# Patient Record
Sex: Male | Born: 1955 | Race: White | Hispanic: No | Marital: Single | Smoking: Former smoker
Health system: Southern US, Community
[De-identification: ages and names within clinical notes are randomized; demographics above are authoritative.]

## PROBLEM LIST (undated history)

## (undated) DIAGNOSIS — E119 Type 2 diabetes mellitus without complications: Secondary | ICD-10-CM

## (undated) DIAGNOSIS — E785 Hyperlipidemia, unspecified: Secondary | ICD-10-CM

## (undated) DIAGNOSIS — I2510 Atherosclerotic heart disease of native coronary artery without angina pectoris: Secondary | ICD-10-CM

## (undated) DIAGNOSIS — I10 Essential (primary) hypertension: Secondary | ICD-10-CM

## (undated) HISTORY — PX: NECK SURGERY: SHX720

## (undated) HISTORY — PX: BACK SURGERY: SHX140

---

## 2015-03-07 ENCOUNTER — Observation Stay (HOSPITAL_COMMUNITY)
Admission: EM | Admit: 2015-03-07 | Discharge: 2015-03-08 | Disposition: A | Discharge status: Home / Self Care | Payer: PRIVATE HEALTH INSURANCE | Attending: Family Medicine | Admitting: Family Medicine

## 2015-03-07 ENCOUNTER — Encounter (HOSPITAL_COMMUNITY): Payer: Self-pay

## 2015-03-07 ENCOUNTER — Emergency Department (HOSPITAL_COMMUNITY): Payer: PRIVATE HEALTH INSURANCE

## 2015-03-07 DIAGNOSIS — I25119 Atherosclerotic heart disease of native coronary artery with unspecified angina pectoris: Secondary | ICD-10-CM

## 2015-03-07 DIAGNOSIS — R9431 Abnormal electrocardiogram [ECG] [EKG]: Secondary | ICD-10-CM | POA: Insufficient documentation

## 2015-03-07 DIAGNOSIS — I10 Essential (primary) hypertension: Secondary | ICD-10-CM

## 2015-03-07 DIAGNOSIS — E119 Type 2 diabetes mellitus without complications: Secondary | ICD-10-CM | POA: Diagnosis not present

## 2015-03-07 DIAGNOSIS — E876 Hypokalemia: Secondary | ICD-10-CM | POA: Diagnosis present

## 2015-03-07 DIAGNOSIS — M549 Dorsalgia, unspecified: Secondary | ICD-10-CM | POA: Diagnosis present

## 2015-03-07 DIAGNOSIS — R079 Chest pain, unspecified: Secondary | ICD-10-CM | POA: Diagnosis not present

## 2015-03-07 DIAGNOSIS — R0600 Dyspnea, unspecified: Secondary | ICD-10-CM

## 2015-03-07 DIAGNOSIS — R55 Syncope and collapse: Secondary | ICD-10-CM | POA: Diagnosis present

## 2015-03-07 DIAGNOSIS — M546 Pain in thoracic spine: Secondary | ICD-10-CM

## 2015-03-07 HISTORY — DX: Type 2 diabetes mellitus without complications: E11.9

## 2015-03-07 HISTORY — DX: Atherosclerotic heart disease of native coronary artery without angina pectoris: I25.10

## 2015-03-07 HISTORY — DX: Hyperlipidemia, unspecified: E78.5

## 2015-03-07 HISTORY — DX: Essential (primary) hypertension: I10

## 2015-03-07 LAB — COMPREHENSIVE METABOLIC PANEL
ALK PHOS: 81 U/L (ref 38–126)
ALT: 15 U/L — ABNORMAL LOW (ref 17–63)
ANION GAP: 10 (ref 5–15)
AST: 23 U/L (ref 15–41)
Albumin: 3.6 g/dL (ref 3.5–5.0)
BUN: 14 mg/dL (ref 6–20)
CALCIUM: 8.7 mg/dL — AB (ref 8.9–10.3)
CO2: 26 mmol/L (ref 22–32)
Chloride: 103 mmol/L (ref 101–111)
Creatinine, Ser: 1.07 mg/dL (ref 0.61–1.24)
GFR calc Af Amer: >60 mL/min (ref >60)
GFR calc non Af Amer: >60 mL/min (ref >60)
GLUCOSE: 123 mg/dL — AB (ref 65–99)
Potassium: 2.9 mmol/L — ABNORMAL LOW (ref 3.5–5.1)
Sodium: 139 mmol/L (ref 135–145)
Total Bilirubin: 0.6 mg/dL (ref 0.3–1.2)
Total Protein: 6.3 g/dL — ABNORMAL LOW (ref 6.5–8.1)

## 2015-03-07 LAB — GLUCOSE, CAPILLARY
Glucose-Capillary: 73 mg/dL (ref 65–99)
Glucose-Capillary: 86 mg/dL (ref 65–99)

## 2015-03-07 LAB — CBC WITH DIFFERENTIAL/PLATELET
BASOS ABS: 0 10*3/uL (ref 0.0–0.1)
BASOS PCT: 0 %
EOS ABS: 0.2 10*3/uL (ref 0.0–0.7)
Eosinophils Relative: 2 %
HCT: 42.2 % (ref 39.0–52.0)
HEMOGLOBIN: 14.5 g/dL (ref 13.0–17.0)
Lymphocytes Relative: 11 %
Lymphs Abs: 0.7 10*3/uL (ref 0.7–4.0)
MCH: 28.8 pg (ref 26.0–34.0)
MCHC: 34.4 g/dL (ref 30.0–36.0)
MCV: 83.7 fL (ref 78.0–100.0)
MONOS PCT: 10 %
Monocytes Absolute: 0.7 10*3/uL (ref 0.1–1.0)
NEUTROS PCT: 77 %
Neutro Abs: 5.1 10*3/uL (ref 1.7–7.7)
Platelets: 186 10*3/uL (ref 150–400)
RBC: 5.04 MIL/uL (ref 4.22–5.81)
RDW: 13.1 % (ref 11.5–15.5)
WBC: 6.7 10*3/uL (ref 4.0–10.5)

## 2015-03-07 LAB — CBC
HCT: 42.3 % (ref 39.0–52.0)
Hemoglobin: 14.4 g/dL (ref 13.0–17.0)
MCH: 28.9 pg (ref 26.0–34.0)
MCHC: 34 g/dL (ref 30.0–36.0)
MCV: 84.8 fL (ref 78.0–100.0)
PLATELETS: 188 10*3/uL (ref 150–400)
RBC: 4.99 MIL/uL (ref 4.22–5.81)
RDW: 13.1 % (ref 11.5–15.5)
WBC: 7.7 10*3/uL (ref 4.0–10.5)

## 2015-03-07 LAB — CREATININE, SERUM
CREATININE: 0.92 mg/dL (ref 0.61–1.24)
GFR calc Af Amer: >60 mL/min (ref >60)
GFR calc non Af Amer: >60 mL/min (ref >60)

## 2015-03-07 LAB — TROPONIN I
Troponin I: <0.03 ng/mL (ref <0.031)
Troponin I: <0.03 ng/mL (ref <0.031)

## 2015-03-07 LAB — MAGNESIUM: MAGNESIUM: 1.8 mg/dL (ref 1.7–2.4)

## 2015-03-07 LAB — CBG MONITORING, ED: Glucose-Capillary: 106 mg/dL — ABNORMAL HIGH (ref 65–99)

## 2015-03-07 LAB — BRAIN NATRIURETIC PEPTIDE: B NATRIURETIC PEPTIDE 5: 83.8 pg/mL (ref 0.0–100.0)

## 2015-03-07 MED ORDER — INSULIN ASPART 100 UNIT/ML ~~LOC~~ SOLN: 0.0000 [IU] | Freq: Three times a day (TID) | SUBCUTANEOUS | Status: DC

## 2015-03-07 MED ORDER — POTASSIUM CHLORIDE CRYS ER 20 MEQ PO TBCR
40.0000 meq | EXTENDED_RELEASE_TABLET | Freq: Once | ORAL | Status: AC
  Administered 2015-03-07: 40 meq via ORAL
  Filled 2015-03-07: qty 2

## 2015-03-07 MED ORDER — SODIUM CHLORIDE 0.9 % IV SOLN: INTRAVENOUS | Status: DC

## 2015-03-07 MED ORDER — INSULIN PUMP
Freq: Three times a day (TID) | SUBCUTANEOUS | Status: DC
  Administered 2015-03-07 – 2015-03-08 (×2): 20 via SUBCUTANEOUS
  Filled 2015-03-07: qty 1

## 2015-03-07 MED ORDER — MAGNESIUM SULFATE 2 GM/50ML IV SOLN
2.0000 g | Freq: Once | INTRAVENOUS | Status: AC
  Administered 2015-03-07: 2 g via INTRAVENOUS
  Filled 2015-03-07: qty 50

## 2015-03-07 MED ORDER — ENOXAPARIN SODIUM 40 MG/0.4ML ~~LOC~~ SOLN
40.0000 mg | SUBCUTANEOUS | Status: DC
  Administered 2015-03-07: 40 mg via SUBCUTANEOUS
  Filled 2015-03-07: qty 0.4

## 2015-03-07 MED ORDER — INSULIN ASPART 100 UNIT/ML ~~LOC~~ SOLN: 0.0000 [IU] | Freq: Every day | SUBCUTANEOUS | Status: DC

## 2015-03-07 MED ORDER — ACETAMINOPHEN 325 MG PO TABS: 650.0000 mg | ORAL_TABLET | ORAL | Status: DC | PRN

## 2015-03-07 MED ORDER — ATORVASTATIN CALCIUM 40 MG PO TABS: 40.0000 mg | ORAL_TABLET | Freq: Every day | ORAL | Status: DC

## 2015-03-07 MED ORDER — MORPHINE SULFATE (PF) 2 MG/ML IV SOLN: 2.0000 mg | INTRAVENOUS | Status: DC | PRN

## 2015-03-07 MED ORDER — ASPIRIN 325 MG PO TABS
325.0000 mg | ORAL_TABLET | Freq: Every day | ORAL | Status: DC
  Administered 2015-03-08: 325 mg via ORAL
  Filled 2015-03-07: qty 1

## 2015-03-07 NOTE — Discharge Summary (Signed)
Family Medicine Teaching [facility] Discharge Summary  Patient name: Richard Robbins Medical record number: [id number] Date of birth: 1956/05/19 Age: 59 y.o. Gender: male Date of Admission: 03/07/2015  Date of Discharge: 03/08/2015 Admitting Physician: Leighton Roach McDiarmid, MD  Primary Care Kalle Bernath: No primary care Nissim Fleischer on file. Consultants: Cardiology  Indication for Hospitalization: ACS rule out  Discharge Diagnoses/Problem List:  Back pain Hypertension Hypokalemia  Disposition: Discharge home  Discharge Condition: Stable  Discharge Exam:  General: male lying in bed in NAD. Wife in room  ENTM: Oropharynx clear. Neck: Full ROM. Non-tender to palpation.  Cardiovascular: Regular rate. Regular rhythm, no murmurs. Respiratory: CTAB. Normal WOB.  Abdomen: obese, soft, NT +BS MSK: Trace LE edema to mid shins. Moves all extremities.  Neuro: Alert and oriented. No gross deficits.  Psych: Appropriate mood and affect.   Brief Hospital Course:   HPI: Richard Robbins is a 59 y.o. male presenting with chest pain located between his shoulder blades. Earlier today, he was outside at the [employer] when he started feeling "funny" and tried to sit down. At that time he started devloping back pain pain with no radiation. This pain is similar to pain he felt when he last had an MI. He reports some associated nausea, lightheadedness and diaphoresis at the time. He reports no fevers, chills. Cardiology and PCP in [place], [place]. He has a history of stent placement two years ago.  Brought in by EMS and was given nitroglycerin. Was hypotensive in route after nitro but stable and conscious. States that Nitro did not relieve his pain. But he is currently pain free. BP stable throughout time in ED. Took all of his medications this morning.   Course: patient observed with ACS rule out protocol. Repeat EKG showed some t-wave inversion in anterior leads. Troponin negative x3. Cardiology  recommended outpatient stress test. Patient's chest pain did not recur and he remained stable throughout admission. CTA obtained to confirm no dissection or PE and was negative.  Issues for Follow Up:  1. Outpatient stress test  Significant Procedures: None  Significant Labs and Imaging:   Recent Labs Lab 03/07/15 1220 03/07/15 1814  WBC 6.7 7.7  HGB 14.5 14.4  HCT 42.2 42.3  PLT 186 188    Recent Labs Lab 03/07/15 1220 03/07/15 1814 03/08/15 0418  NA 139  --  139  K 2.9*  --  3.4*  CL 103  --  104  CO2 26  --  28  GLUCOSE 123*  --  75  BUN 14  --  11  CREATININE 1.07 0.92 0.78  CALCIUM 8.7*  --  8.8*  MG  --  1.8 2.1  ALKPHOS 81  --   --   AST 23  --   --   ALT 15*  --   --   ALBUMIN 3.6  --   --     Cardiac Panel (last 3 results)  Recent Labs  03/07/15 1814 03/07/15 2233 03/08/15 0418  TROPONINI <0.03 <0.03 <0.03   Risk Stratification Labs  TSH    Component Value Date/Time   TSH 1.286 03/08/2015 0418   Hemoglobin A1C No results found for: HGBA1C Lipid Panel     Component Value Date/Time   CHOL 156 03/08/2015 0418   TRIG 137 03/08/2015 0418   HDL 20* 03/08/2015 0418   CHOLHDL 7.8 03/08/2015 0418   VLDL 27 03/08/2015 0418   LDLCALC 109* 03/08/2015 0418    Results/Tests Pending at Time of Discharge: Hemoglobin A1C  Discharge Medications:    Medication List    TAKE these medications        aspirin 325 MG tablet  Take 325 mg by mouth daily.     atorvastatin 10 MG tablet  Commonly known as:  LIPITOR  Take 10 mg by mouth daily.     doxazosin 8 MG tablet  Commonly known as:  CARDURA  Take 8 mg by mouth daily.     insulin pump Soln  Inject into the skin 3 (three) times daily.     losartan-hydrochlorothiazide 100-25 MG per tablet  Commonly known as:  HYZAAR  Take 1 tablet by mouth daily.     metFORMIN 1000 MG tablet  Commonly known as:  GLUCOPHAGE  Take 1,000 mg by mouth 2 (two) times daily with a meal.     nitroGLYCERIN 0.4 MG  SL tablet  Commonly known as:  NITROSTAT  Place 0.4 mg under the tongue every 5 (five) minutes as needed for chest pain.        Discharge Instructions: Please refer to Patient Instructions section of EMR for full details.  Patient was counseled important signs and symptoms that should prompt return to medical care, changes in medications, dietary instructions, activity restrictions, and follow up appointments.   Follow-Up Appointments: Follow-up Information    Follow up with Primary Care Physician. Schedule an appointment as soon as possible for a visit in 1 week.   Why:  For hospital follow-up      Narda Bonds, MD 03/09/2015, 9:03 AM PGY-3, [facility]

## 2015-03-07 NOTE — Progress Notes (Signed)
   03/07/15 1226  Clinical Encounter Type  Visited With Patient and family together;Health care provider  Visit Type Initial  Referral From Care management   Chaplain responded to page from ED, and offered transport and support to the patient's wife. Chaplain support available as needed.   Alda Ponder, Chaplain 03/07/2015 12:27 PM

## 2015-03-07 NOTE — ED Provider Notes (Signed)
CSN: [id number]     Arrival date & time 03/07/15  1211 History   First MD Initiated Contact with Patient 03/07/15 1213     No chief complaint on file.    (Consider location/radiation/quality/duration/timing/severity/associated sxs/prior Treatment) HPI Comments: Patient here complaining of sudden onset of pain between her shoulder blades similar to his anginal equivalent associated with near syncope. Does have a history of coronary artery disease and this is his similar to his prior MI symptoms. Had associated diaphoresis and slight dyspnea. EMS called and they gave patient nitroglycerin and dropped his blood pressure to 98 systolic. Pain did improve. His pain did not radiate to his arms. No recent fever chills or cough symptoms. Denies any leg pain or swelling.  The history is provided by the patient.    No past medical history on file. No past surgical history on file. No family history on file. Social History  Substance Use Topics  . Smoking status: Not on file  . Smokeless tobacco: Not on file  . Alcohol Use: Not on file    Review of Systems  All other systems reviewed and are negative.     Allergies  Review of patient's allergies indicates not on file.  Home Medications   Prior to Admission medications   Not on File   There were no vitals taken for this visit. Physical Exam  Constitutional: He is oriented to person, place, and time. He appears well-developed and well-nourished.  Non-toxic appearance. No distress.  HENT:  Head: Normocephalic and atraumatic.  Eyes: Conjunctivae, EOM and lids are normal. Pupils are equal, round, and reactive to light.  Neck: Normal range of motion. Neck supple. No tracheal deviation present. No thyroid mass present.  Cardiovascular: Normal rate, regular rhythm and normal heart sounds.  Exam reveals no gallop.   No murmur heard. Pulmonary/Chest: Effort normal and breath sounds normal. No stridor. No respiratory distress. He has no  decreased breath sounds. He has no wheezes. He has no rhonchi. He has no rales.  Abdominal: Soft. Normal appearance and bowel sounds are normal. He exhibits no distension. There is no tenderness. There is no rebound and no CVA tenderness.  Musculoskeletal: Normal range of motion. He exhibits no edema or tenderness.  Neurological: He is alert and oriented to person, place, and time. He has normal strength. No cranial nerve deficit or sensory deficit. GCS eye subscore is 4. GCS verbal subscore is 5. GCS motor subscore is 6.  Skin: Skin is warm and dry. No abrasion and no rash noted.  Psychiatric: He has a normal mood and affect. His speech is normal and behavior is normal.  Nursing note and vitals reviewed.   ED Course  Procedures (including critical care time) Labs Review Labs Reviewed  CBC WITH DIFFERENTIAL/PLATELET  COMPREHENSIVE METABOLIC PANEL  TROPONIN I    Imaging Review No results found. I have personally reviewed and evaluated these images and lab results as part of my medical decision-making.   EKG Interpretation   Date/Time:  Saturday March 07 2015 12:11:06 EDT Ventricular Rate:  87 PR Interval:  162 QRS Duration: 101 QT Interval:  481 QTC Calculation: 579 R Axis:   62 Text Interpretation:  Sinus rhythm Probable left atrial enlargement  Borderline T wave abnormalities Prolonged QT interval Confirmed by Freida Busman   MD, ANTHONY ([id number]) on 03/07/2015 12:17:35 PM      MDM   Final diagnoses:  Chest pain   patient to be admitted for evaluation of chest pain   Ethelene Browns  Freida Busman, MD 03/07/15 1429

## 2015-03-07 NOTE — ED Notes (Signed)
Pt remains monitored by blood pressure, pulse ox, and 12 lead. Pts family remains at bedside.  

## 2015-03-07 NOTE — ED Notes (Signed)
Pt placed on monitor upon arrival to room. Pt monitored by blood pressure, pulse ox, and 12 lead. pts EKG given to and signed by Dr. Freida Busman.

## 2015-03-07 NOTE — H&P (Signed)
Family Medicine Teaching [facility] Admission History and Physical Service Pager: [phone]  Patient name: Richard Robbins Medical record number: [id number] Date of birth: [birth year] Age: 59 y.o. Gender: male  Primary Care Provider: No primary care provider on file. Consultants: Cardiology Code Status: Full per discussion on admission   Chief Complaint: Back pain  Assessment and Plan: Darious Rehman is a 59 y.o. male presenting with back pain concerning for ACS . PMH is significant for CAD with h/o of stent placement x2 years ago, HTN, BPH, and T2DM on insulin pump.   Posterior Chest Pain: Patient reports pain between his shoulder blades similar to pain he felt when had MI 2 years ago. Pain has since resolved. VSS with 96% O2 sat on RA. iStat Troponin < 0.03. EKG normal sinus rhythm with diffuse flattening of T waves and prolonged QTc of 579. CXR demonstrated mild cardiomegaly. HEART score calculated to be 5. Given patient's cardiac history, presenting symptoms, and risk factors will proceed with ACS r/o.  -admit to inpatient and place in observation with telemetry  -consult cardiology  -trend troponin x3 q6h -repeat EKG in AM  -supplemental O2 for sats <92% prn  -continue ASA and Lipitor  -morphine  q2h for severe chest pain -did not order zofran 2/2 to prolonged QTc - risk start (A1C, TSH, Lipid panel)  Hypokalemia: K of 2.9 in the ED. Given of Kdur in ED.  -ordered of Kdur  -Mag ordered and will replete prn   Magnesium: 1.8 on admission. Would like to keep above 2 - mag citrate 2g x1 - recheck mag in AM  DM Type II: Patient managed with insulin pump at home.  - continue insulin pump via order set -hold metformin  Hypertension: Stable at admission -hold anti-hypertensive medications for now; resume as needed -continue to monitor   BPH  -hold Doxazosin   FEN/GI: Heart healthy diet, SLIV Prophylaxis: Lovenox 40 mg q24 hours   Disposition: Place in  observation with telemetry; FPTS Attending McDiarmid   History of Present Illness:  Richard Robbins is a 59 y.o. male presenting with chest pain located between his shoulder blades. Earlier today, he was outside at the [employer] when he started feeling "funny" and tried to sit down. At that time he started devloping back pain pain with no radiation. This pain is similar to pain he felt when he last had an MI. He reports some associated nausea, lightheadedness and diaphoresis at the time. He reports no fevers, chills. Cardiology and PCP in [place], [place]. He has a history of stent placement two years ago.  Brought in by EMS and was given nitroglycerin. Was hypotensive in route after nitro but stable and conscious. States that Nitro did not relieve his pain. But he is currently pain free. BP stable throughout time in ED. Took all of his medications this morning.   Pharmacy to perform med rec for patient. He remembers that he takes the following:  Lipitor, dose unknown Aspirin  Metformin  BID Doxazosin, dose unknown Insulin pump Unknown antihypertensive  Review Of Systems: Per HPI with the following additions: None  Otherwise 12 point review of systems was performed and was unremarkable.  Patient Active Problem List   Diagnosis Date Noted  . Chest pain 03/07/2015   Past Medical History: Past Medical History  Diagnosis Date  . Diabetes mellitus without complication   . Hypertension   . Hyperlipemia    Past Surgical History: Past Surgical History  Procedure Laterality Date  .  Back surgery    Neck surgery  Social History: Social History  Substance Use Topics  . Smoking status: Former Smoker    Types: Cigarettes  . Smokeless tobacco: None  . Alcohol Use: No   Additional social history: Denies drug use.   Please also refer to relevant sections of EMR.  Family History: History reviewed. No pertinent family history. Allergies and Medications: Allergies  Allergen  Reactions  . Protamine Swelling   No current facility-administered medications on file prior to encounter.   No current outpatient prescriptions on file prior to encounter.    Objective: BP 140/73 mmHg  Pulse 80  Temp(Src) 99.2 F (37.3 C) (Oral)  Resp 24  Ht  (1.88 m)  Wt 338 lb (153.316 kg)  BMI 43.38 kg/m2  SpO2 96% Exam: General: male lying in bed in NAD, wife and son at bedside  Eyes: EOMI. Pupils equal and round.  ENTM: Oropharynx clear. Neck: Full ROM. Non-tender to palpation.  Cardiovascular: Regular rate. Occasionally irregular rhythm. PVCs noted on monitor. No murmurs appreciated. Distal pulses intact. Chest wall and back non-tender to palpation.  Respiratory: CTAB. Normal WOB.  Abdomen: obese, soft, NT +BS MSK: 1+ LE edema to mid shins. Moves all extremities.  Neuro: Alert and oriented. No gross deficits.  Psych: Appropriate mood and affect.   Labs and Imaging: CBC BMET   Recent Labs Lab 03/07/15 1220  WBC 6.7  HGB 14.5  HCT 42.2  PLT 186    Recent Labs Lab 03/07/15 1220  NA 139  K 2.9*  CL 103  CO2 26  BUN 14  CREATININE 1.07  GLUCOSE 123*  CALCIUM 8.7*     Cardiac Panel (last 3 results)  Recent Labs  03/07/15 1220 03/07/15 1814  TROPONINI <0.03 <0.03    Risk Stratification Labs  TSH No results found for: TSH Hemoglobin A1C No results found for: HGBA1C Lipid Panel  No results found for: CHOL, TRIG, HDL, CHOLHDL, VLDL, LDLCALC   EKG (2/95 in ED): Vent. rate 87 BPM PR interval 162 ms QRS duration 101 ms QT/QTc 481/579 ms P-R-T axes 45 62 9 Sinus rhythm Probable left atrial enlargement Borderline T wave abnormalities Prolonged QT interval  Dg Chest 2 View  03/07/2015   CLINICAL DATA:  Posterior chest pain between shoulder blades. Shortness of breath for 2 hours.  EXAM: CHEST  2 VIEW  COMPARISON:  None.  FINDINGS: Mild cardiomegaly. Mediastinal contours are within normal limits. Mild interstitial prominence throughout  the lungs, likely chronic although early interstitial edema cannot be excluded. No confluent opacities or effusions. No acute bony abnormality.  IMPRESSION: Mild cardiomegaly. Interstitial prominence, favor chronic lung disease although interstitial edema cannot be excluded.   Electronically Signed   By: Charlett Nose M.D.   On: 03/07/2015 12:55    Marcy Siren, D.O. 03/07/2015, 3:36 PM PGY-1, [facility] FPTS Intern pager: [phone], text pages welcome  I have seen and examined the patient. I have read and agree with the above note. My changes are noted in blue.  Jacquelin Hawking, MD PGY-3, [facility] 03/07/2015, 10:03 PM

## 2015-03-07 NOTE — ED Notes (Signed)
Attempted report x1. 

## 2015-03-07 NOTE — ED Notes (Signed)
Per EMS patient coming from festival where he was walking around and  became light headed and complaining of pain between shoulders.  EMS reports that gave 1 nitro and 500 mL of NS and 4 mg of zofran in route, EMS CBG 106  Patient reports a pain of 2 on 0-10, takes a daily aspirin of 325 mg (took this morning) Reports that he is diabetic (insulin resistant).  Patient A&O X4, NAD at this time.  Wife at bedside.

## 2015-03-08 ENCOUNTER — Encounter (HOSPITAL_COMMUNITY): Payer: Self-pay | Admitting: Physician Assistant

## 2015-03-08 ENCOUNTER — Observation Stay (HOSPITAL_COMMUNITY): Payer: PRIVATE HEALTH INSURANCE

## 2015-03-08 DIAGNOSIS — E119 Type 2 diabetes mellitus without complications: Secondary | ICD-10-CM

## 2015-03-08 DIAGNOSIS — I10 Essential (primary) hypertension: Secondary | ICD-10-CM | POA: Diagnosis not present

## 2015-03-08 DIAGNOSIS — R079 Chest pain, unspecified: Secondary | ICD-10-CM | POA: Insufficient documentation

## 2015-03-08 DIAGNOSIS — R9431 Abnormal electrocardiogram [ECG] [EKG]: Secondary | ICD-10-CM | POA: Insufficient documentation

## 2015-03-08 DIAGNOSIS — M546 Pain in thoracic spine: Secondary | ICD-10-CM | POA: Diagnosis not present

## 2015-03-08 LAB — BASIC METABOLIC PANEL
ANION GAP: 7 (ref 5–15)
BUN: 11 mg/dL (ref 6–20)
CALCIUM: 8.8 mg/dL — AB (ref 8.9–10.3)
CO2: 28 mmol/L (ref 22–32)
Chloride: 104 mmol/L (ref 101–111)
Creatinine, Ser: 0.78 mg/dL (ref 0.61–1.24)
GFR calc Af Amer: >60 mL/min (ref >60)
Glucose, Bld: 75 mg/dL (ref 65–99)
POTASSIUM: 3.4 mmol/L — AB (ref 3.5–5.1)
SODIUM: 139 mmol/L (ref 135–145)

## 2015-03-08 LAB — LIPID PANEL
CHOL/HDL RATIO: 7.8 ratio
CHOLESTEROL: 156 mg/dL (ref 0–200)
HDL: 20 mg/dL — AB (ref >40)
LDL Cholesterol: 109 mg/dL — ABNORMAL HIGH (ref 0–99)
TRIGLYCERIDES: 137 mg/dL (ref <150)
VLDL: 27 mg/dL (ref 0–40)

## 2015-03-08 LAB — TSH: TSH: 1.286 u[IU]/mL (ref 0.350–4.500)

## 2015-03-08 LAB — GLUCOSE, CAPILLARY
GLUCOSE-CAPILLARY: 269 mg/dL — AB (ref 65–99)
Glucose-Capillary: 61 mg/dL — ABNORMAL LOW (ref 65–99)

## 2015-03-08 LAB — MAGNESIUM: MAGNESIUM: 2.1 mg/dL (ref 1.7–2.4)

## 2015-03-08 LAB — TROPONIN I

## 2015-03-08 MED ORDER — IOHEXOL 350 MG/ML SOLN
100.0000 mL | Freq: Once | INTRAVENOUS | Status: AC | PRN
  Administered 2015-03-08: 100 mL via INTRAVENOUS

## 2015-03-08 MED ORDER — POTASSIUM CHLORIDE CRYS ER 20 MEQ PO TBCR
40.0000 meq | EXTENDED_RELEASE_TABLET | ORAL | Status: AC
Start: 2015-03-08 — End: 2015-03-08
  Administered 2015-03-08 (×2): 40 meq via ORAL
  Filled 2015-03-08 (×2): qty 2

## 2015-03-08 MED ORDER — HYDROCHLOROTHIAZIDE 25 MG PO TABS
25.0000 mg | ORAL_TABLET | Freq: Every day | ORAL | Status: DC
  Administered 2015-03-08: 25 mg via ORAL
  Filled 2015-03-08: qty 1

## 2015-03-08 MED ORDER — LOSARTAN POTASSIUM-HCTZ 100-25 MG PO TABS
1.0000 | ORAL_TABLET | Freq: Every day | ORAL | Status: DC
Start: 2015-03-08 — End: 2015-03-08

## 2015-03-08 MED ORDER — LOSARTAN POTASSIUM 50 MG PO TABS
100.0000 mg | ORAL_TABLET | Freq: Every day | ORAL | Status: DC
  Administered 2015-03-08: 100 mg via ORAL
  Filled 2015-03-08: qty 2

## 2015-03-08 NOTE — Consult Note (Signed)
CARDIOLOGY CONSULT NOTE   Patient ID: Richard Robbins MRN: [id number], DOB/AGE: 08-11-1955   Admit date: 03/07/2015 Date of Consult: 03/08/2015   Primary Physician: No primary care provider on file. Primary Cardiologist: Dr. Deon Pilling with [facility] in [place]  Pt. Profile  Morbidly obese 59 year old male with history of HTN, HLD, IDDM, and CAD s/p PCI to LCx in 2014 presented with acute onset of back pain between his shoulder blade  Problem List  Past Medical History  Diagnosis Date  . Diabetes mellitus without complication   . Hypertension   . Hyperlipemia   . CAD (coronary artery disease)     s/p cath with stent to LCx in 2014 [place], relook cath 2 month later for dyspnea which showed stable anatomy, dyspnea felt related to brilinta, switched to plavix    Past Surgical History  Procedure Laterality Date  . Back surgery      lower back surgery for herniated disc  . Neck surgery      neck surgery for herniated disc     Allergies  Allergies  Allergen Reactions  . Protamine Swelling    HPI   The patient is morbidly obese 59 year old male with history of HTN, HLD, IDDM, and CAD s/p PCI to LCx in 2014. According to the patient, he initially told his PCP in 2014 about dyspnea on exertion and pain between shoulder blades who referred him to a cardiologist and eventually he underwent cardiac cath and got a stent to the left circumflex artery. He went back 2 month later and underwent relook cath for dyspnea which showed patent stent and stable coronary anatomy, dyspnea was felt to be related to Brilinta, he was switched to Plavix. He has since come off Plavix after 1 year, and continued on aspirin.  He has been doing very well at home without significant discomfort since. He was last seen by his cardiologist 6 months ago and at that time he was doing well. He works as a Engineer, structural in [place]. He has not noticed any recent decline in his  functional status. He was at an antique festival in [place] on 03/07/2015 when he had sudden onset of pain between his shoulder blade, shortness of breath and diaphoresis. The episode lasted about 40 minutes before going away around 12:30 PM on 9/24. Due to the similarity of the discomfort with his previous experienced prior to the last PCI, he decided to seek medical attention. Interestingly, patient denies any recent dyspnea on exertion unlike what he experienced prior to the last PCI. He was eventually transferred to [facility] and was admitted to family medicine service. Overnight, his troponin was negative 4. Chest x-ray negative for acute process. EKG showed normal sinus rhythm with poor R-wave progression, otherwise no significant ST-T wave changes. Cardiology has been consulted for chest pain.   Inpatient Medications  . aspirin  325 mg Oral Daily  . atorvastatin  40 mg Oral q1800  . enoxaparin (LOVENOX) injection  40 mg Subcutaneous Q24H  . losartan  100 mg Oral Daily   And  . hydrochlorothiazide  25 mg Oral Daily  . insulin pump   Subcutaneous TID AC, HS, 0200  . potassium chloride  40 mEq Oral Q4H    Family History Family History  Problem Relation Age of Onset  . Heart attack Father   . Hypertension Father   . Hyperlipidemia Father   . Diabetes Father   . Heart attack Brother   . Lung  cancer Father      Social History Social History   Social History  . Marital Status: N/A    Spouse Name: N/A  . Number of Children: N/A  . Years of Education: N/A   Occupational History  . Not on file.   Social History Main Topics  . Smoking status: Former Smoker    Types: Cigarettes  . Smokeless tobacco: Not on file     Comment: smoked for 16 yrs between age 71 until age 26  . Alcohol Use: No  . Drug Use: No  . Sexual Activity: Not on file   Other Topics Concern  . Not on file   Social History Narrative     Review of Systems  General:  No chills, fever,  night sweats or weight changes.  Cardiovascular:  No dyspnea on exertion, edema, orthopnea, palpitations, paroxysmal nocturnal dyspnea. + Acute back pain, dyspnea and diaphoresis Dermatological: No rash, lesions/masses Respiratory: No cough, dyspnea Urologic: No hematuria, dysuria Abdominal:   No nausea, vomiting, diarrhea, bright red blood per rectum, melena, or hematemesis Neurologic:  No visual changes, wkns, changes in mental status. All other systems reviewed and are otherwise negative except as noted above.  Physical Exam  Blood pressure 166/78, pulse 88, temperature 97.8 F (36.6 C), temperature source Oral, resp. rate 20, height  (1.88 m), weight 337 lb 12.8 oz (153.225 kg), SpO2 94 %.  General: Pleasant, NAD Psych: Normal affect. Neuro: Alert and oriented X 3. Moves all extremities spontaneously. HEENT: Normal  Neck: Supple without bruits or JVD. Lungs:  Resp regular and unlabored, CTA. Heart: RRR no s3, s4, or murmurs. Abdomen: Soft, non-tender, non-distended, BS + x 4.  Extremities: No clubbing, cyanosis or edema. DP/PT/Radials 2+ and equal bilaterally.  Labs   Recent Labs  03/07/15 1220 03/07/15 1814 03/07/15 2233 03/08/15 0418  TROPONINI <0.03 <0.03 <0.03 <0.03   Lab Results  Component Value Date   WBC 7.7 03/07/2015   HGB 14.4 03/07/2015   HCT 42.3 03/07/2015   MCV 84.8 03/07/2015   PLT 188 03/07/2015    Recent Labs Lab 03/07/15 1220  03/08/15 0418  NA 139  --  139  K 2.9*  --  3.4*  CL 103  --  104  CO2 26  --  28  BUN 14  --  11  CREATININE 1.07  < > 0.78  CALCIUM 8.7*  --  8.8*  PROT 6.3*  --   --   BILITOT 0.6  --   --   ALKPHOS 81  --   --   ALT 15*  --   --   AST 23  --   --   GLUCOSE 123*  --  75  < > = values in this interval not displayed. Lab Results  Component Value Date   CHOL 156 03/08/2015   HDL 20* 03/08/2015   LDLCALC 109* 03/08/2015   TRIG 137 03/08/2015   No results found for: DDIMER  Radiology/Studies  Dg  Chest 2 View  03/07/2015   CLINICAL DATA:  Posterior chest pain between shoulder blades. Shortness of breath for 2 hours.  EXAM: CHEST  2 VIEW  COMPARISON:  None.  FINDINGS: Mild cardiomegaly. Mediastinal contours are within normal limits. Mild interstitial prominence throughout the lungs, likely chronic although early interstitial edema cannot be excluded. No confluent opacities or effusions. No acute bony abnormality.  IMPRESSION: Mild cardiomegaly. Interstitial prominence, favor chronic lung disease although interstitial edema cannot be excluded.   Electronically  Signed   By: Charlett Nose M.D.   On: 03/07/2015 12:55    ECG  Normal sinus rhythm with poor progression in the anterior lead and the no significant ST-T wave changes.  ASSESSMENT AND PLAN  1. Pain between shoulder blade without radiation and occurring at rest  - Although the location of the back pain is similar to the last PCI, however patient denies any recent dyspnea on exertion, he has been ruled out for ACS with serial troponin and EKG.  - consider treadmill tolerance test vs myoview, although not sure if POET can be obtain on the weekend, myoview has to be tomorrow since he already ate today. If negative, then discharge to followup with his cardiologist in [place]   2. CAD s/p PCI to LCx 2014  - relook cath 2 month after original PCI for dyspnea which was attributed to brilinta side effect   3. HTN  4. HLD: LDL 109, HDL 20, consider increasing Lipitor to 80 mg, LDL goal <70 with DM and CAD  5. 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 Morbid obesity, weight 337, HDL 20, need more exercise  Signed, Amedeo Plenty 03/08/2015, 10:39 AM  Cardiology Attending  Patient seen and examined. I agree with the findings as noted above. The patient has multiple cardiac risk factors and came in with back pain but no sob and has ruled out for MI. His exam is notable for morbid obesity. His symptoms have resolved. He would like to go back to  [place] and followup with his cardiologist. I think he is an acceptable risk for discharge today. His wife is willing to drive him back home. He has had one relook cath already. His morbid obesity makes non-invasive assessment problematic. No change in meds. Note plans for CT scan which is reasonable. If CT negative, ok for discharge home.  Leonia Reeves.D.

## 2015-03-08 NOTE — Discharge Instructions (Signed)
Chest Pain Observation  It is often hard to give a specific diagnosis for the cause of chest pain. Among other possibilities your symptoms might be caused by inadequate oxygen delivery to your heart (angina). Angina that is not treated or evaluated can lead to a heart attack (myocardial infarction) or death.  Blood tests, electrocardiograms, and X-rays may have been done to help determine a possible cause of your chest pain. After evaluation and observation, your health care provider has determined that it is unlikely your pain was caused by an unstable condition that requires hospitalization. However, a full evaluation of your pain may need to be completed, with additional diagnostic testing as directed. It is very important to keep your follow-up appointments. Not keeping your follow-up appointments could result in permanent heart damage, disability, or death. If there is any problem keeping your follow-up appointments, you must call your health care provider.  HOME CARE INSTRUCTIONS   Due to the slight chance that your pain could be angina, it is important to follow your health care provider's treatment plan and also maintain a healthy lifestyle:  · Maintain or work toward achieving a healthy weight.  · Stay physically active and exercise regularly.  · Decrease your salt intake.  · Eat a balanced, healthy diet. Talk to a dietitian to learn about heart-healthy foods.  · Increase your fiber intake by including whole grains, vegetables, fruits, and nuts in your diet.  · Avoid situations that cause stress, anger, or depression.  · Take medicines as advised by your health care provider. Report any side effects to your health care provider. Do not stop medicines or adjust the dosages on your own.  · Quit smoking. Do not use nicotine patches or gum until you check with your health care provider.  · Keep your blood pressure, blood sugar, and cholesterol levels within normal limits.  · Limit alcohol intake to no more than  1 drink per day for women who are not pregnant and 2 drinks per day for men.  · Do not abuse drugs.  SEEK IMMEDIATE MEDICAL CARE IF:  You have severe chest pain or pressure which may include symptoms such as:  · You feel pain or pressure in your arms, neck, jaw, or back.  · You have severe back or abdominal pain, feel sick to your stomach (nauseous), or throw up (vomit).  · You are sweating profusely.  · You are having a fast or irregular heartbeat.  · You feel short of breath while at rest.  · You notice increasing shortness of breath during rest, sleep, or with activity.  · You have chest pain that does not get better after rest or after taking your usual medicine.  · You wake from sleep with chest pain.  · You are unable to sleep because you cannot breathe.  · You develop a frequent cough or you are coughing up blood.  · You feel dizzy, faint, or experience extreme fatigue.  · You develop severe weakness, dizziness, fainting, or chills.  Any of these symptoms may represent a serious problem that is an emergency. Do not wait to see if the symptoms will go away. Call your local emergency services (911 in the [place] Do not drive yourself to the hospital.  MAKE SURE YOU:  · Understand these instructions.  · Will watch your condition.  · Will get help right away if you are not doing well or get worse.  Document Released: 07/02/2010 Document Revised: 06/04/2013 Document Reviewed: 11/29/2012    [employer]® Patient Information ©2015 [employer]. This information is not intended to replace advice given to you by your health care provider. Make sure you discuss any questions you have with your health care provider.

## 2015-03-08 NOTE — Progress Notes (Signed)
Family Medicine Teaching Service Daily Progress Note Intern Pager: [phone]  Patient name: Richard Robbins Medical record number: [id number] Date of birth: [birth year] Age: 59 y.o. Gender: male  Primary Care Provider: No primary care provider on file. Consultants: Cardiology Code Status: Full Code  Pt Overview and Major Events to Date:  9/24: admitted  Assessment and Plan: Richard Robbins is a 59 y.o. male presenting with back pain concerning for ACS . PMH is significant for CAD with h/o of stent placement x2 years ago, HTN, BPH, and T2DM on insulin pump.   Posterior Chest Pain: Patient reports pain between his shoulder blades similar to pain he felt when had MI 2 years ago. Pain has since resolved. VSS with 96% O2 sat on RA. iStat Troponin < 0.03. EKG normal sinus rhythm with diffuse flattening of T waves and prolonged QTc of 579. CXR demonstrated mild cardiomegaly. HEART score calculated to be 5. Given patient's cardiac history, presenting symptoms, and risk factors will proceed with ACS r/o. Troponin negative x3 -consult cardiology  -supplemental O2 for sats <92% prn  -continue ASA and Lipitor  -morphine  q2h for severe chest pain -did not order zofran 2/2 to prolonged QTc - CTA to rule out dissection/PE  Hypokalemia: K improved to 3.4. - of Kdur q4 hours x2 doses  Hypomagnesemia: repleted to 2.1  DM Type II: Patient managed with insulin pump at home.  - continue insulin pump via order set -hold metformin  Hypertension: Stable at admission - restart losartan-hctz -continue to monitor   BPH  -hold Doxazosin   FEN/GI: Heart healthy diet, SLIV Prophylaxis: Lovenox 40 mg q24 hours  Disposition: Home pending ACS workup completion  Subjective:  No pain, dyspnea or diaphoresis.   Objective: Temp:  [97.8 F (36.6 C)-99.2 F (37.3 C)] 97.8 F (36.6 C) (09/25 0500) Pulse Rate:  [77-88] 88 (09/25 0751) Resp:  [17-25] 20 (09/25 0500) BP: (120-166)/(54-78) 166/78  mmHg (09/25 0500) SpO2:  [94 %-99 %] 94 % (09/25 0751) Weight:  [337 lb 12.8 oz (153.225 kg)] 337 lb 12.8 oz (153.225 kg) (09/25 0500) Physical Exam: General: Well appearing, no distress Cardiovascular: Regular rate and rhythm, no murmurs Respiratory: Clear to auscultation Abdomen: Soft, non-tender, non-distended Extremities: Trace edema  Laboratory:  Recent Labs Lab 03/07/15 1220 03/07/15 1814  WBC 6.7 7.7  HGB 14.5 14.4  HCT 42.2 42.3  PLT 186 188    Recent Labs Lab 03/07/15 1220 03/07/15 1814 03/08/15 0418  NA 139  --  139  K 2.9*  --  3.4*  CL 103  --  104  CO2 26  --  28  BUN 14  --  11  CREATININE 1.07 0.92 0.78  CALCIUM 8.7*  --  8.8*  PROT 6.3*  --   --   BILITOT 0.6  --   --   ALKPHOS 81  --   --   ALT 15*  --   --   AST 23  --   --   GLUCOSE 123*  --  75     Imaging/Diagnostic Tests: Dg Chest 2 View  03/07/2015   CLINICAL DATA:  Posterior chest pain between shoulder blades. Shortness of breath for 2 hours.  EXAM: CHEST  2 VIEW  COMPARISON:  None.  FINDINGS: Mild cardiomegaly. Mediastinal contours are within normal limits. Mild interstitial prominence throughout the lungs, likely chronic although early interstitial edema cannot be excluded. No confluent opacities or effusions. No acute bony abnormality.  IMPRESSION: Mild cardiomegaly. Interstitial prominence, favor chronic  lung disease although interstitial edema cannot be excluded.   Electronically Signed   By: Charlett Nose M.D.   On: 03/07/2015 12:55    Narda Bonds, MD 03/08/2015, 1:12 PM PGY-3, [facility] Family Medicine FPTS Intern pager: [phone], text pages welcome

## 2015-03-09 LAB — HEMOGLOBIN A1C
Hgb A1c MFr Bld: 8 % — ABNORMAL HIGH (ref 4.8–5.6)
Mean Plasma Glucose: 183 mg/dL

## 2016-03-11 IMAGING — CT CT ANGIO CHEST
2 of 5 series · 18 of 36 positions shown · IV contrast (Omni 300)
Comparison: Radiographs 03/07/2015.

CLINICAL DATA: Bilateral thoracic back pain, acute dyspnea and
dizziness. History of diabetes and coronary artery disease. Evaluate
for pulmonary embolism. Initial encounter.

EXAM:
CT ANGIOGRAPHY CHEST WITH CONTRAST
TECHNIQUE: Multidetector CT imaging of the chest was performed using the
standard protocol during bolus administration of intravenous
contrast. Multiplanar CT image reconstructions and MIPs were
obtained to evaluate the vascular anatomy.
CONTRAST:  100mL OMNIPAQUE IOHEXOL 350 MG/ML SOLN

[Series 4: pe 3mm · axial · 0.96mm/px · z∈[+1079,+1370]mm · 17 of 105 slices shown]
[im 4/105  lung]
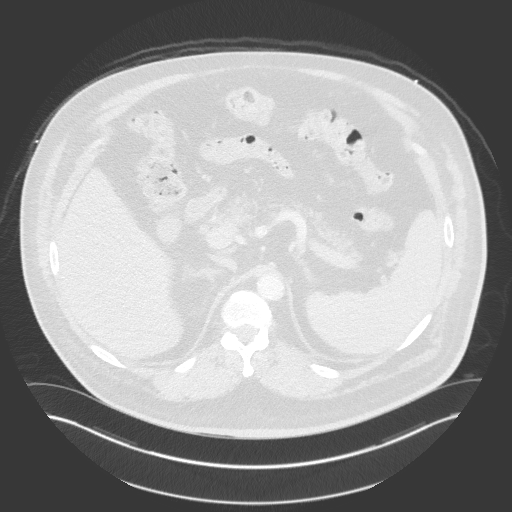
[im 12/105  mediastinal]
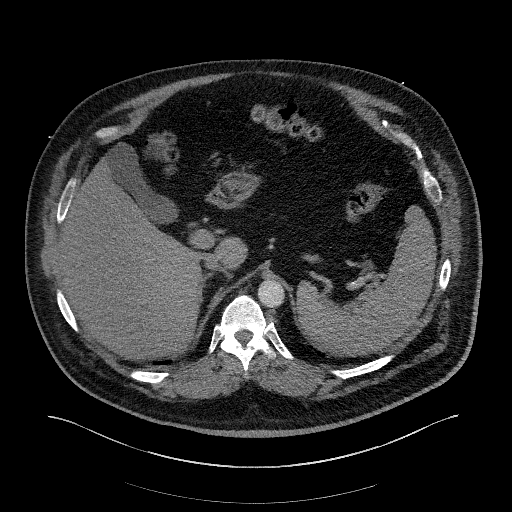
[im 16/105  lung]
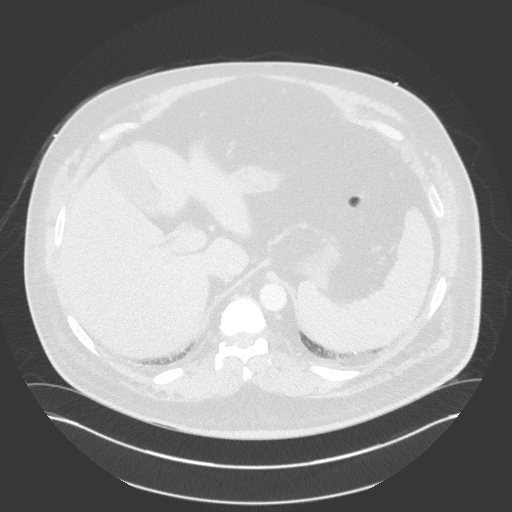
[im 24/105  mediastinal]
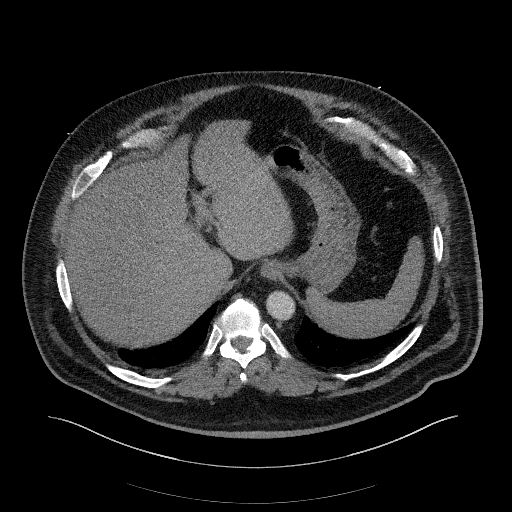
[im 27/105  lung]
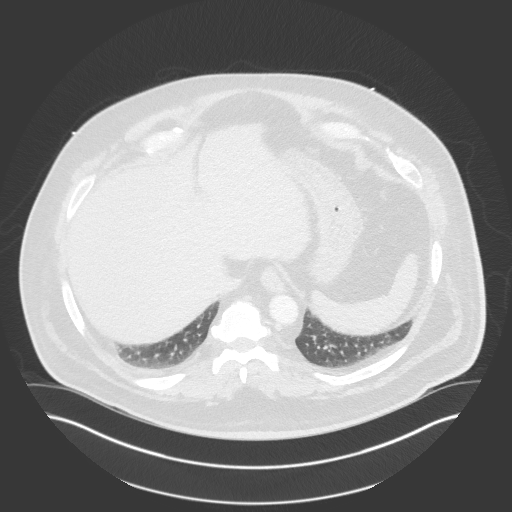
[im 35/105  mediastinal]
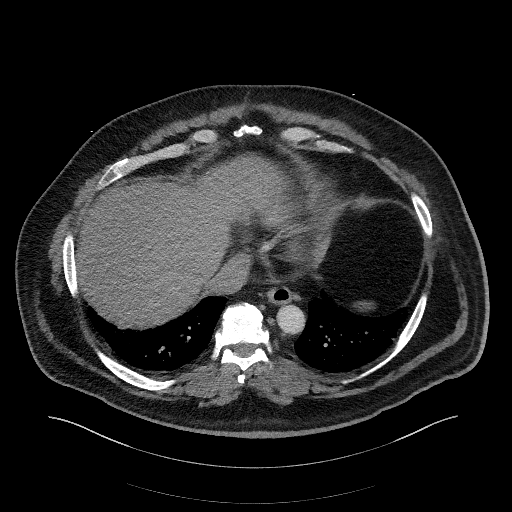
[im 39/105  lung]
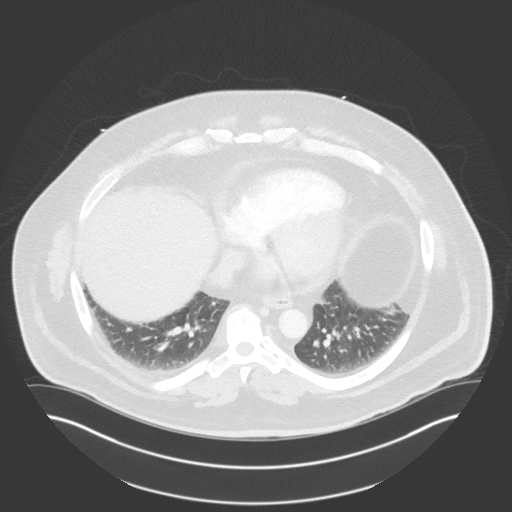
[im 47/105  mediastinal]
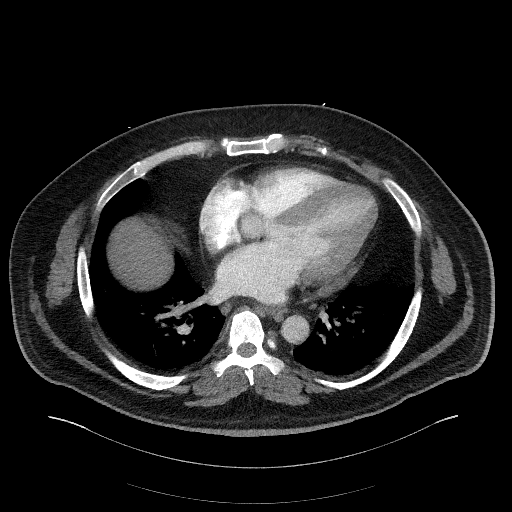
[im 54/105  lung]
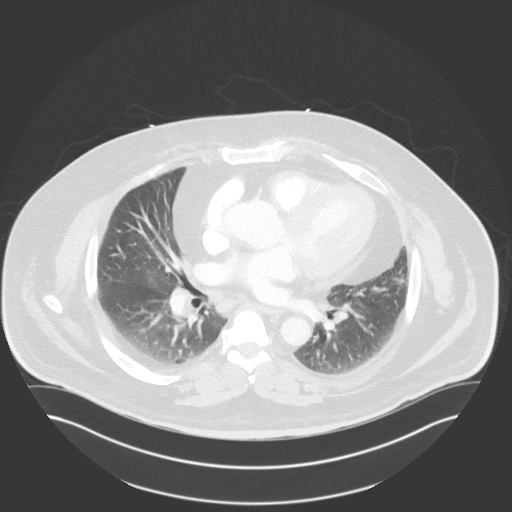
[im 58/105  mediastinal]
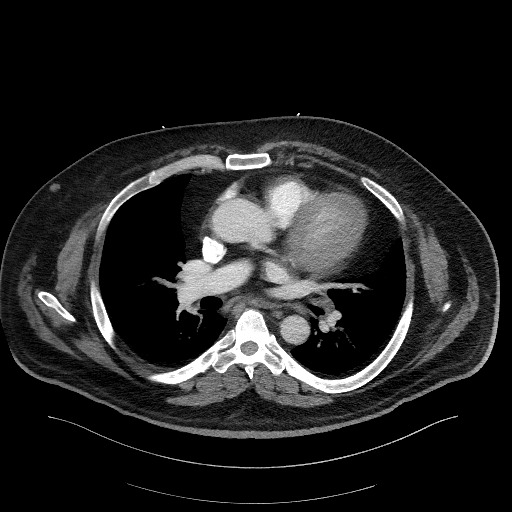
[im 66/105  lung]
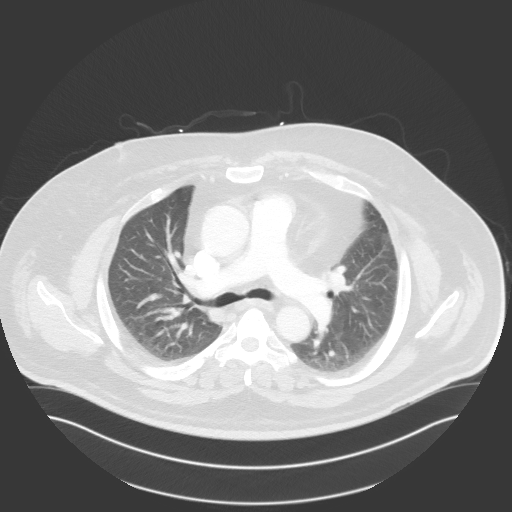
[im 70/105  mediastinal]
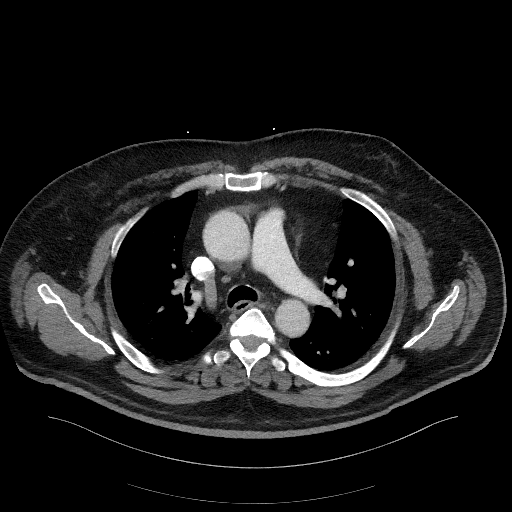
[im 78/105  lung]
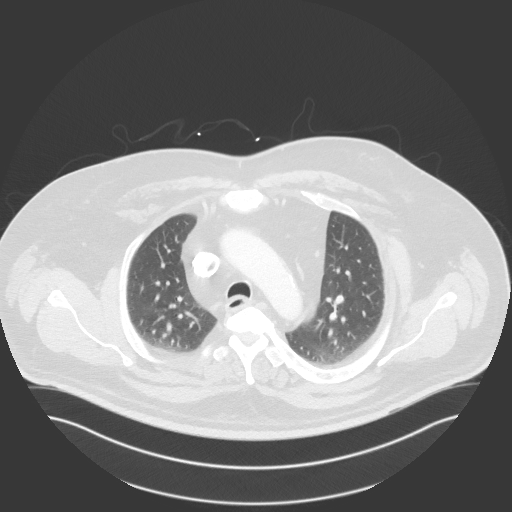
[im 81/105  mediastinal]
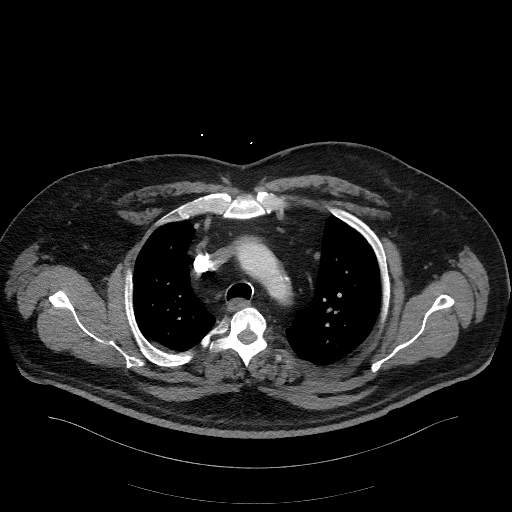
[im 89/105  lung]
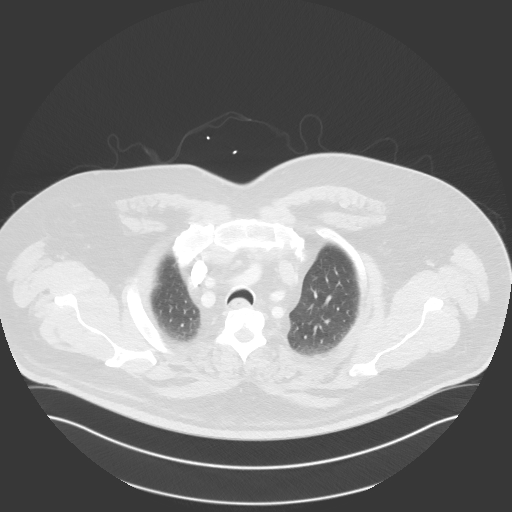
[im 93/105  mediastinal]
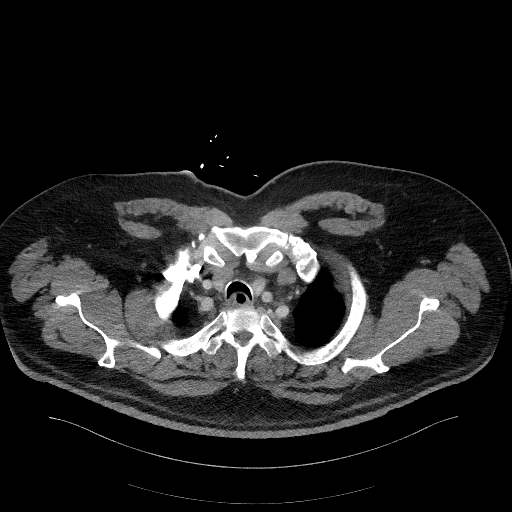
[im 101/105  lung]
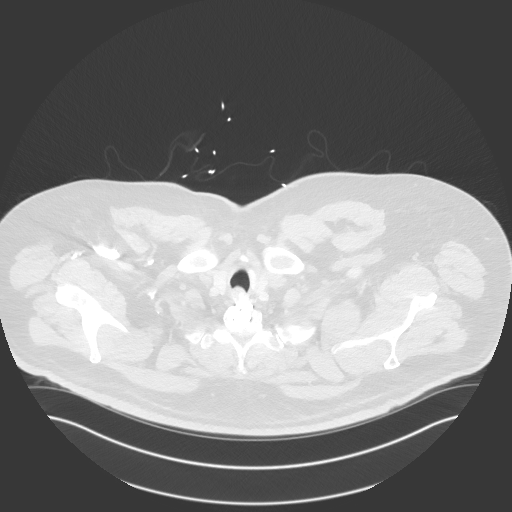

[Series 7: pe 2mm cor · coronal · 0.62mm/px · 1 of 106 slices shown]
[im 53/106  mediastinal]
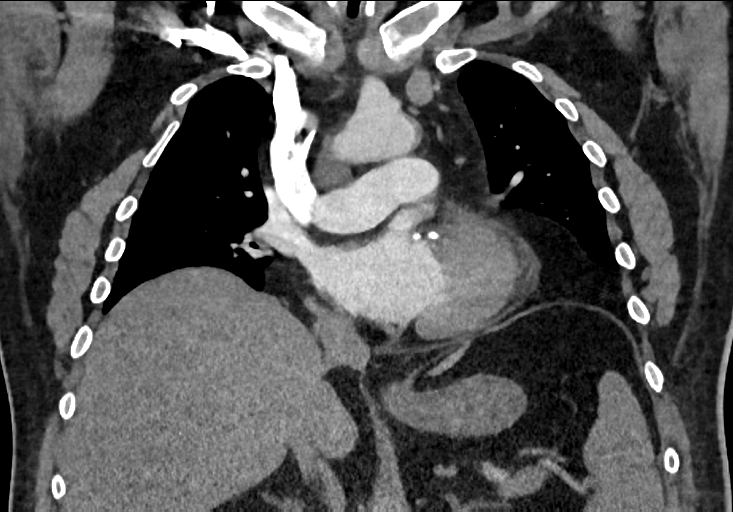

[18 of 36 positions shown; findings below may reference images not displayed]

FINDINGS: Mediastinum: The pulmonary arteries are well opacified with
contrast. There is no evidence of acute pulmonary embolism. There is
minimal atherosclerosis of the aorta and coronary arteries.The heart
is mildly enlarged. There is minimal pericardial fluid. There are no
enlarged mediastinal, hilar or axillary lymph nodes. The thyroid
gland, trachea and esophagus demonstrate no significant findings.

Lungs/Pleura: There is no pleural effusion.There is minimal
atelectasis at both lung bases. The lungs are otherwise clear.

Upper abdomen: The liver demonstrates decreased density consistent
with steatosis. The visualized upper abdomen otherwise appears
unremarkable.

Musculoskeletal/Chest wall: No chest wall lesion or acute osseous
findings.Lower cervical fusion and mild thoracic scoliosis noted.
The right third rib is bifid anteriorly.

Review of the MIP images confirms the above findings.
IMPRESSION: 1. No evidence of acute pulmonary embolism or other acute chest
process.
2. Mild cardiomegaly and atherosclerosis.
3. Hepatic steatosis.

## 2024-04-13 DEATH — deceased
